# Patient Record
Sex: Male | Born: 1965 | Race: White | Hispanic: No | Marital: Single | Smoking: Never smoker
Health system: Southern US, Community
[De-identification: ages and names within clinical notes are randomized; demographics above are authoritative.]

## PROBLEM LIST (undated history)

## (undated) DIAGNOSIS — J45909 Unspecified asthma, uncomplicated: Secondary | ICD-10-CM

## (undated) HISTORY — PX: SALIVARY GLAND SURGERY: SHX768

## (undated) HISTORY — PX: CHOLECYSTECTOMY: SHX55

---

## 1999-04-13 ENCOUNTER — Encounter: Payer: Self-pay | Admitting: *Deleted

## 1999-04-13 ENCOUNTER — Encounter: Admission: RE | Admit: 1999-04-13 | Discharge: 1999-04-13 | Payer: Self-pay | Admitting: *Deleted

## 1999-07-23 ENCOUNTER — Encounter: Admission: RE | Admit: 1999-07-23 | Discharge: 1999-07-23 | Payer: Self-pay | Admitting: *Deleted

## 1999-07-23 ENCOUNTER — Encounter: Payer: Self-pay | Admitting: *Deleted

## 2003-01-26 ENCOUNTER — Encounter: Payer: Self-pay | Admitting: Emergency Medicine

## 2003-01-27 ENCOUNTER — Inpatient Hospital Stay (HOSPITAL_COMMUNITY): Admission: EM | Admit: 2003-01-27 | Discharge: 2003-01-31 | Payer: Self-pay | Admitting: Emergency Medicine

## 2003-01-30 ENCOUNTER — Encounter (INDEPENDENT_AMBULATORY_CARE_PROVIDER_SITE_OTHER): Payer: Self-pay | Admitting: *Deleted

## 2006-10-31 ENCOUNTER — Ambulatory Visit: Payer: Self-pay | Admitting: Internal Medicine

## 2010-05-28 ENCOUNTER — Emergency Department (HOSPITAL_COMMUNITY)
Admission: EM | Admit: 2010-05-28 | Discharge: 2010-05-29 | Payer: Self-pay | Source: Home / Self Care | Admitting: Emergency Medicine

## 2010-05-29 ENCOUNTER — Encounter (INDEPENDENT_AMBULATORY_CARE_PROVIDER_SITE_OTHER): Payer: Self-pay | Admitting: *Deleted

## 2010-06-01 LAB — URINALYSIS, ROUTINE W REFLEX MICROSCOPIC
Bilirubin Urine: NEGATIVE
Hgb urine dipstick: NEGATIVE
Nitrite: NEGATIVE
Protein, ur: NEGATIVE mg/dL
Specific Gravity, Urine: 1.034 — ABNORMAL HIGH (ref 1.005–1.030)
Urine Glucose, Fasting: NEGATIVE mg/dL
Urobilinogen, UA: 0.2 mg/dL (ref 0.0–1.0)
pH: 7.5 (ref 5.0–8.0)

## 2010-06-01 LAB — DIFFERENTIAL
Basophils Absolute: 0 10*3/uL (ref 0.0–0.1)
Basophils Relative: 0 % (ref 0–1)
Eosinophils Absolute: 0.1 10*3/uL (ref 0.0–0.7)
Eosinophils Relative: 1 % (ref 0–5)
Lymphocytes Relative: 15 % (ref 12–46)
Lymphs Abs: 1.5 10*3/uL (ref 0.7–4.0)
Monocytes Absolute: 0.8 10*3/uL (ref 0.1–1.0)
Monocytes Relative: 7 % (ref 3–12)
Neutro Abs: 7.9 10*3/uL — ABNORMAL HIGH (ref 1.7–7.7)
Neutrophils Relative %: 76 % (ref 43–77)

## 2010-06-01 LAB — CBC
HCT: 47.2 % (ref 39.0–52.0)
Hemoglobin: 16.3 g/dL (ref 13.0–17.0)
MCH: 31.1 pg (ref 26.0–34.0)
MCHC: 34.5 g/dL (ref 30.0–36.0)
MCV: 90.1 fL (ref 78.0–100.0)
Platelets: 214 10*3/uL (ref 150–400)
RBC: 5.24 MIL/uL (ref 4.22–5.81)
RDW: 12.4 % (ref 11.5–15.5)
WBC: 10.3 10*3/uL (ref 4.0–10.5)

## 2010-06-01 LAB — COMPREHENSIVE METABOLIC PANEL
ALT: 44 U/L (ref 0–53)
AST: 28 U/L (ref 0–37)
Albumin: 4.1 g/dL (ref 3.5–5.2)
Alkaline Phosphatase: 63 U/L (ref 39–117)
BUN: 11 mg/dL (ref 6–23)
CO2: 27 mEq/L (ref 19–32)
Calcium: 9.5 mg/dL (ref 8.4–10.5)
Chloride: 105 mEq/L (ref 96–112)
Creatinine, Ser: 1.24 mg/dL (ref 0.4–1.5)
GFR calc Af Amer: >60 mL/min (ref >60)
GFR calc non Af Amer: >60 mL/min (ref >60)
Glucose, Bld: 117 mg/dL — ABNORMAL HIGH (ref 70–99)
Potassium: 4 mEq/L (ref 3.5–5.1)
Sodium: 141 mEq/L (ref 135–145)
Total Bilirubin: 0.7 mg/dL (ref 0.3–1.2)
Total Protein: 7.3 g/dL (ref 6.0–8.3)

## 2010-06-01 LAB — LIPASE, BLOOD: Lipase: 30 U/L (ref 11–59)

## 2010-07-10 ENCOUNTER — Emergency Department (HOSPITAL_COMMUNITY): Payer: BC Managed Care – PPO

## 2010-07-10 ENCOUNTER — Emergency Department (HOSPITAL_COMMUNITY)
Admission: EM | Admit: 2010-07-10 | Discharge: 2010-07-10 | Disposition: A | Discharge status: Home / Self Care | Payer: BC Managed Care – PPO | Attending: Emergency Medicine | Admitting: Emergency Medicine

## 2010-07-10 ENCOUNTER — Encounter (INDEPENDENT_AMBULATORY_CARE_PROVIDER_SITE_OTHER): Payer: Self-pay | Admitting: *Deleted

## 2010-07-10 DIAGNOSIS — K5730 Diverticulosis of large intestine without perforation or abscess without bleeding: Secondary | ICD-10-CM | POA: Insufficient documentation

## 2010-07-10 DIAGNOSIS — K7689 Other specified diseases of liver: Secondary | ICD-10-CM | POA: Insufficient documentation

## 2010-07-10 DIAGNOSIS — R109 Unspecified abdominal pain: Secondary | ICD-10-CM | POA: Insufficient documentation

## 2010-07-10 LAB — URINALYSIS, ROUTINE W REFLEX MICROSCOPIC
Bilirubin Urine: NEGATIVE
Hgb urine dipstick: NEGATIVE
Ketones, ur: >80 mg/dL — AB
Nitrite: NEGATIVE
Protein, ur: NEGATIVE mg/dL
Specific Gravity, Urine: 1.026 (ref 1.005–1.030)
Urine Glucose, Fasting: NEGATIVE mg/dL
Urobilinogen, UA: 0.2 mg/dL (ref 0.0–1.0)
pH: 5.5 (ref 5.0–8.0)

## 2010-07-10 LAB — DIFFERENTIAL
Basophils Absolute: 0 10*3/uL (ref 0.0–0.1)
Basophils Relative: 0 % (ref 0–1)
Eosinophils Absolute: 0 10*3/uL (ref 0.0–0.7)
Eosinophils Relative: 0 % (ref 0–5)
Lymphocytes Relative: 9 % — ABNORMAL LOW (ref 12–46)
Lymphs Abs: 1.1 10*3/uL (ref 0.7–4.0)
Monocytes Absolute: 0.4 10*3/uL (ref 0.1–1.0)
Monocytes Relative: 3 % (ref 3–12)
Neutro Abs: 11.2 10*3/uL — ABNORMAL HIGH (ref 1.7–7.7)
Neutrophils Relative %: 88 % — ABNORMAL HIGH (ref 43–77)

## 2010-07-10 LAB — LIPASE, BLOOD: Lipase: 25 U/L (ref 11–59)

## 2010-07-10 LAB — COMPREHENSIVE METABOLIC PANEL
ALT: 66 U/L — ABNORMAL HIGH (ref 0–53)
AST: 40 U/L — ABNORMAL HIGH (ref 0–37)
Albumin: 4.5 g/dL (ref 3.5–5.2)
Alkaline Phosphatase: 56 U/L (ref 39–117)
BUN: 14 mg/dL (ref 6–23)
CO2: 24 mEq/L (ref 19–32)
Calcium: 9.9 mg/dL (ref 8.4–10.5)
Chloride: 104 mEq/L (ref 96–112)
Creatinine, Ser: 1.13 mg/dL (ref 0.4–1.5)
GFR calc Af Amer: >60 mL/min (ref >60)
GFR calc non Af Amer: >60 mL/min (ref >60)
Glucose, Bld: 152 mg/dL — ABNORMAL HIGH (ref 70–99)
Potassium: 4 mEq/L (ref 3.5–5.1)
Sodium: 139 mEq/L (ref 135–145)
Total Bilirubin: 1.3 mg/dL — ABNORMAL HIGH (ref 0.3–1.2)
Total Protein: 7.6 g/dL (ref 6.0–8.3)

## 2010-07-10 LAB — CBC
HCT: 50.4 % (ref 39.0–52.0)
Hemoglobin: 17.3 g/dL — ABNORMAL HIGH (ref 13.0–17.0)
MCH: 30.5 pg (ref 26.0–34.0)
MCHC: 34.3 g/dL (ref 30.0–36.0)
MCV: 88.9 fL (ref 78.0–100.0)
Platelets: 228 10*3/uL (ref 150–400)
RBC: 5.67 MIL/uL (ref 4.22–5.81)
RDW: 12.2 % (ref 11.5–15.5)
WBC: 12.7 10*3/uL — ABNORMAL HIGH (ref 4.0–10.5)

## 2010-07-10 MED ORDER — IOHEXOL 300 MG/ML  SOLN
100.0000 mL | Freq: Once | INTRAMUSCULAR | Status: AC | PRN
  Administered 2010-07-10: 100 mL via INTRAVENOUS

## 2010-07-14 ENCOUNTER — Ambulatory Visit: Payer: BC Managed Care – PPO | Admitting: Gastroenterology

## 2010-07-14 DIAGNOSIS — R69 Illness, unspecified: Secondary | ICD-10-CM

## 2010-07-24 ENCOUNTER — Other Ambulatory Visit (HOSPITAL_COMMUNITY): Payer: Self-pay | Admitting: Gastroenterology

## 2010-07-24 DIAGNOSIS — R1011 Right upper quadrant pain: Secondary | ICD-10-CM

## 2010-08-06 ENCOUNTER — Other Ambulatory Visit (HOSPITAL_COMMUNITY): Payer: BC Managed Care – PPO

## 2010-08-10 ENCOUNTER — Other Ambulatory Visit (HOSPITAL_COMMUNITY): Payer: Self-pay | Admitting: Gastroenterology

## 2010-08-10 ENCOUNTER — Ambulatory Visit (HOSPITAL_COMMUNITY)
Admission: RE | Admit: 2010-08-10 | Discharge: 2010-08-10 | Disposition: A | Discharge status: Home / Self Care | Payer: BC Managed Care – PPO | Source: Ambulatory Visit | Attending: Gastroenterology | Admitting: Gastroenterology

## 2010-08-10 DIAGNOSIS — R1011 Right upper quadrant pain: Secondary | ICD-10-CM | POA: Insufficient documentation

## 2010-08-10 MED ORDER — TECHNETIUM TC 99M MEBROFENIN IV KIT
5.0000 | PACK | Freq: Once | INTRAVENOUS | Status: AC | PRN
  Administered 2010-08-10: 5 via INTRAVENOUS

## 2010-09-29 NOTE — Assessment & Plan Note (Signed)
[facility]                             PULMONARY OFFICE NOTE   BLISS, TSANG                        MRN:          [id number]  DATE:10/31/2006                            DOB:          02-Apr-1966    This is a pulmonary consultation requested by Clifton Custard   REASON FOR CONSULTATION:  Cough.   HISTORY:  This is a 45 year old white male last seen here in 2004 with a  cough that had developed after exposure to pertussis.  Since that time,  3-4 times a year he gets a bad cough that lasts for several weeks.  His present exacerbation started after an apparent URI with flu-like  symptoms, about a month ago and has persisted with a hacking cough that  is more daytime than nighttime, more dry than wet in nature with a  sensation that he has got something stuck in his throat.  He has  already been started on Symbicort with no benefit.  He also has received  prednisone with no benefit.  Although he states he takes Prevacid, it is  not consistent.   It is not consistent because he doesn't think he has heartburn.   He denies any weather, environmental change, fevers, chills, sweats,  orthopnea, PND, or leg swelling.  No sinus or reflux symptoms.   PAST MEDICAL HISTORY:  Significant on for a diagnosis of asthma.  It is  interesting that he reports this as a diagnosis, as my feeling before he  did not have asthma.   ALLERGIES:  PENICILLIN, TETRACYCLINE CAUSE A RASH.   MEDICATIONS:  1. Prevacid 30 mg once or twice weekly.  2. Symbicort 160/4.5, 2 puffs b.i.d.   SOCIAL HISTORY:  He has never smoked.  He works as a Emergency planning/management officer.   FAMILY HISTORY:  Positive for allergies, the whole family, and  asthma in his mother.   REVIEW OF SYSTEMS:  Taken and reviewed in the work sheet.  Negative  except as outlined above.   PHYSICAL EXAMINATION:  GENERAL:  This is an ambulatory, obese white male  in no acute distress but who clears his throat frequently  during the  interview and exam.  HEENT:  Oropharynx is clear.  However, with no evidence of excessive  postnasal drip.  NECK:  Supple without cervical adenopathy or tenderness.  Trachea is  midline.  No lymphadenopathy.  LUNGS:  Lung fields are perfectly clear bilaterally to auscultation and  percussion with no cough elicited on inspiratory or expiratory  maneuvers.  HEART:  Regular rate and rhythm without murmur, gallop, or rub present.  ABDOMEN:  Soft, benign without palpable organomegaly, mass, or  tenderness.  EXTREMITIES:  Warm without calf tenderness, cyanosis, clubbing, or  edema.   CT scan of the chest done on October 21, 2006, is normal.   IMPRESSION:  Classic upper airway cough syndrome suggests either a  reflux mechanism or cough-inducing reflex inducing more cough via a  cyclical mechanism.   RECOMMENDATIONS:  Take Prevacid perfectly regularly before the first  meal every day.  In fact, he can add a second  one until he is better  from his present exacerbation and use Delsym 2 teaspoons q.12h. plus  Tramadol or hydrocodone q.4h. to suppress excessive coughing.   I spent extra time with this patient today going over the mechanism for  cyclical coughing and the role that reflux may be playing here.   The next step in the workup if this is not successful is to obtain a  sinus CT scan and a methacholine challenge test while on high dose  proton pump inhibitor therapy.     Charlaine Dalton. Sherene Sires, MD, [facility]  Electronically Signed    MBW/MedQ  DD: 10/31/2006  DT: 11/01/2006  Job #: [id number]

## 2010-10-02 NOTE — Discharge Summary (Signed)
Peter Pena, Peter Pena                         ACCOUNT NO.:  [id number]   MEDICAL RECORD NO.:  [id number]                   PATIENT TYPE:  INP   LOCATION:  5702                                 FACILITY:  [facility]   PHYSICIAN:  Lonia Blood, M.D.            DATE OF BIRTH:  1965/10/09   DATE OF ADMISSION:  01/27/2003  DATE OF DISCHARGE:  01/31/2003                                 DISCHARGE SUMMARY   DISCHARGE DIAGNOSES:  1. Reported history of asthma.  2. History of gastroesophageal reflux disease.  3. Allergy to penicillin and Tetracycline.  4. Severe chronic cough of unknown etiology.   DISCHARGE MEDICATIONS:  1. Prednisone 20 mg a day x5 days and then 10 mg a day.  2. Tessalon 100 mg t.i.d.  3. Combivent MDI two puffs q.i.d.  4. Pulmicort MDI two puffs b.i.d.  5. Protonix 40 mg b.i.d.  6. Claritin 10 mg daily.  7. Tussionex 5 mL b.i.d. p.r.n.  8. Tylenol No.3 two every six hours p.r.n.   FOLLOW UP:  The patient will follow up with [name], M.D. at  [facility].  This is the patient's choice as he desires to  change his primary care physician.   CONSULTATIONS:  None.   PROCEDURE:  1. Diagnostic upper GI - sliding hiatal hernia.  Mild reflux.  No other     abnormality.  2. CT scan of the chest.  No evidence of abnormality.   HISTORY OF PRESENT ILLNESS:  The patient is a 45 year old gentleman who was  previously followed at [facility].  He had been followed in  their office for a chronic cough which had developed approximately two weeks  prior to his admission.  On the day of admission, he presented to the office  with marked increase in severity of his cough to the point that the patient  was not able to catch his breath on two different occasions.  The evaluating  physician at that time was concerned that the patient likely had pertussis  and the patient was therefore admitted to the hospital and placed in a  negative pressure  isolation room.   HOSPITAL COURSE:  Chronic severe cough.  The exact etiology of the patient's  cough was not clear. There was concern by the admitting physician that this  could represent pertussis.  The patient was initially kept in respiratory  isolation in a negative pressure room because of concern for pertussis.  Bordetella pertussis panel was obtained and sent to the lab.  Unfortunately,  this is a test that has to be sent out and there was significant delay.  As  a result, I discussed the case with an infectious disease physician who  advised me that the patient could be discontinued from isolation after he  had been treated for approximately five days of therapy.  Considering that  the patient had been initiated on the  Z-Pak prior to his admission, I felt  that he was stable to be discharged from respiratory isolation on January 29, 2003.  In retrospect, it is now clear that the patient's pertussis panel  is consistent with immunization.  The patient was questioned as to  immunization history and did also state that he had all of his immunizations  as a child and growing up and therefore the clinical suspicion for pertussis  was extremely low. There was greater concern that this was reflux disease.  Upper GI series was obtained and revealed a sliding hiatal hernia, but no  other significant findings were appreciated.  There were also multiple  episodes of wheezing and therefore it was felt that this patient's cough  could also be related to significant bronchospasm and asthma.  As a result,  treatment for bronchospasm was maximized during this hospitalization to  include inhaler and prednisone therapy.  The patient was also placed on  Claritin.  Treatment of gastroesophageal reflux disease was maximized as  well with proton pump inhibitor being dosed twice a day.  The patient was  also given multiple antitussives.  The patient does not have a history of  tobacco abuse, but the  decision was made to pursue CT scan of the chest  because there was fear that we could have missed a lesion on plain film.  CT  scan of the chest was obtained and was in fact unremarkable.  At this time,  the patient's symptoms had improved, though, not resolved.  He was felt to  be stable and not contagious.  The patient was cleared for discharge from  acute unit and was advised to follow up with his primary care physician in  short course.  The next step would likely be bronchoscopy by pulmonologist  should the patient's symptoms not resolved.                                                Lonia Blood, M.D.    JTM/MEDQ  D:  03/20/2003  T:  03/21/2003  Job:  [id number]

## 2010-10-02 NOTE — H&P (Signed)
NAME:  Peter Pena, Peter Pena                         ACCOUNT NO.:  [id number]   MEDICAL RECORD NO.:  [id number]                   PATIENT TYPE:  EMS   LOCATION:  MAJO                                 FACILITY:  [facility]   PHYSICIAN:  Gloriajean Dell. Andrey Campanile, M.D.                DATE OF BIRTH:  Oct 15, 1965   DATE OF ADMISSION:  01/26/2003  DATE OF DISCHARGE:                                HISTORY & PHYSICAL   IDENTIFICATION:  A 45 year old married white male from [place].   CHIEF COMPLAINT:  Severe cough until he cannot breathe times two weeks.   HISTORY OF PRESENT ILLNESS:  For two weeks, the patient has had episodic  severe nonproductive cough which comes in spells without fever or other  symptoms.  He was treated in the office this week without apparent benefit  with Tussionex, Z-Pak on his fourth day, Tylenol No. 3 (he only took one at  a time).  Today he was seen in the office.  A chest x-ray was negative.  He  started on prednisone, but only took one dose of 20 mg, and it has not  helped.  He came to the emergency room.  Nebulizer treatment did not help.  He was admitted for cough control and further treatment and consultation.   PAST MEDICAL HISTORY:   ALLERGIES:  1. PENICILLIN.  2. TETRACYCLINE.   MEDICATIONS:  As above.   PAST HISTORY:  1. History of asthma; none recently.  He gets about one episode a year,     which usually responds very well to prednisone and inhalers.  2. History of GERD about three years ago, treated with Prilosec, but none     recently.   FAMILY AND SOCIAL HISTORY:  The patient does not smoke any cigarettes or  excessive alcohol.  His work is as a Nurse, [place].  He has had  no travel.  No one in his family or at work has had a cough.  He is married  and has three children; all are well.  His mother is living and well.  Father died of cardiomyopathy.   REVIEW OF SYSTEMS:  Otherwise unremarkable.   PHYSICAL EXAMINATION:  GENERAL:  A  well-developed, non-obese white male in  no acute distress, except when he is coughing.  He is alert, oriented, and  appropriate.  Speech is clear.  VITAL SIGNS:  Temperature 98.8, pulse 111, respirations 20, blood pressure  123/74.  O2 saturation 99% on room air.  SKIN:  Without rash or lesions.  HEENT:  TM's and ear canals are clear.  Nose and throat clear.  NECK:  Supple, no adenopathy.  Sinuses nontender.  LUNGS:  Clear to auscultation and percussion.  While examining the patient,  he is having repetitive dry cough until he gets out of breath, turns  slightly cyanotic, then takes a deep breath, and returns to normal.  ABDOMEN:  Benign.  CHEST:  Chest wall nontender.  EXTREMITIES:  Normal.  There is no edema, cyanosis, or clubbing.   Chest x-ray reveals no disease.   LABORATORY DATA:  CBC with white count 11,100.  Differential with 72%  neutrophils, 22% lymphocytes, 5% monocytes, 1% basophils.  Hemoglobin 16.1,  platelets 274,000.  MCV 87.  Sodium 139, potassium 3.7, BUN 17, creatinine  1.0, calcium 9.4, glucose 123.  Arterial blood gas on room air revealed pH  of 7.407, PCO2 38.2, PO2 75.  O2 saturation 95.   ASSESSMENT:  1. Severe paroxysmal cough for two weeks, consider pertussis.  2. History of asthma, which has not been active recently.  3. History of gastroesophageal reflux disease, which has not been active     recently.   PLAN:  1. Anti-tussive treatment, codeine, Tessalon Perles, and prednisone.     Continue Zithromax, increasing dose to 500 mg a day.  2. Consultation with I. D. or pulmonary.  3. Oxygen as needed.  4. Respiratory isolation.                                                Gloriajean Dell. Andrey Campanile, M.D.    FHW/MEDQ  D:  01/27/2003  T:  01/27/2003  Job:  [id number]

## 2013-04-16 ENCOUNTER — Ambulatory Visit
Admission: RE | Admit: 2013-04-16 | Discharge: 2013-04-16 | Disposition: A | Discharge status: Home / Self Care | Payer: BC Managed Care – PPO | Source: Ambulatory Visit | Attending: Family Medicine | Admitting: Family Medicine

## 2013-04-16 ENCOUNTER — Other Ambulatory Visit: Payer: Self-pay | Admitting: Family Medicine

## 2013-04-16 DIAGNOSIS — R079 Chest pain, unspecified: Secondary | ICD-10-CM

## 2015-04-26 ENCOUNTER — Encounter (HOSPITAL_COMMUNITY): Payer: Self-pay | Admitting: Nurse Practitioner

## 2015-04-26 ENCOUNTER — Emergency Department (HOSPITAL_COMMUNITY)
Admission: EM | Admit: 2015-04-26 | Discharge: 2015-04-27 | Disposition: A | Discharge status: Home / Self Care | Payer: BLUE CROSS/BLUE SHIELD | Attending: Emergency Medicine | Admitting: Emergency Medicine

## 2015-04-26 ENCOUNTER — Emergency Department (HOSPITAL_COMMUNITY): Payer: BLUE CROSS/BLUE SHIELD

## 2015-04-26 DIAGNOSIS — J40 Bronchitis, not specified as acute or chronic: Secondary | ICD-10-CM | POA: Diagnosis not present

## 2015-04-26 DIAGNOSIS — Z792 Long term (current) use of antibiotics: Secondary | ICD-10-CM | POA: Diagnosis not present

## 2015-04-26 DIAGNOSIS — Z880 Allergy status to penicillin: Secondary | ICD-10-CM | POA: Insufficient documentation

## 2015-04-26 DIAGNOSIS — R197 Diarrhea, unspecified: Secondary | ICD-10-CM | POA: Insufficient documentation

## 2015-04-26 DIAGNOSIS — R109 Unspecified abdominal pain: Secondary | ICD-10-CM | POA: Diagnosis not present

## 2015-04-26 DIAGNOSIS — Z79899 Other long term (current) drug therapy: Secondary | ICD-10-CM | POA: Diagnosis not present

## 2015-04-26 DIAGNOSIS — R509 Fever, unspecified: Secondary | ICD-10-CM | POA: Diagnosis not present

## 2015-04-26 LAB — COMPREHENSIVE METABOLIC PANEL
ALBUMIN: 3.8 g/dL (ref 3.5–5.0)
ALK PHOS: 44 U/L (ref 38–126)
ALT: 42 U/L (ref 17–63)
ANION GAP: 11 (ref 5–15)
AST: 29 U/L (ref 15–41)
BILIRUBIN TOTAL: 1.6 mg/dL — AB (ref 0.3–1.2)
BUN: 17 mg/dL (ref 6–20)
CO2: 23 mmol/L (ref 22–32)
Calcium: 9.1 mg/dL (ref 8.9–10.3)
Chloride: 104 mmol/L (ref 101–111)
Creatinine, Ser: 1.43 mg/dL — ABNORMAL HIGH (ref 0.61–1.24)
GFR calc Af Amer: >60 mL/min (ref >60)
GFR calc non Af Amer: 55 mL/min — ABNORMAL LOW (ref >60)
GLUCOSE: 108 mg/dL — AB (ref 65–99)
POTASSIUM: 3.3 mmol/L — AB (ref 3.5–5.1)
SODIUM: 138 mmol/L (ref 135–145)
TOTAL PROTEIN: 6.4 g/dL — AB (ref 6.5–8.1)

## 2015-04-26 LAB — CBC
HEMATOCRIT: 47.4 % (ref 39.0–52.0)
HEMOGLOBIN: 16.1 g/dL (ref 13.0–17.0)
MCH: 30.3 pg (ref 26.0–34.0)
MCHC: 34 g/dL (ref 30.0–36.0)
MCV: 89.3 fL (ref 78.0–100.0)
Platelets: 224 10*3/uL (ref 150–400)
RBC: 5.31 MIL/uL (ref 4.22–5.81)
RDW: 12.7 % (ref 11.5–15.5)
WBC: 11.3 10*3/uL — ABNORMAL HIGH (ref 4.0–10.5)

## 2015-04-26 LAB — I-STAT CG4 LACTIC ACID, ED: Lactic Acid, Venous: 1.2 mmol/L (ref 0.5–2.0)

## 2015-04-26 MED ORDER — SODIUM CHLORIDE 0.9 % IV BOLUS (SEPSIS)
1000.0000 mL | Freq: Once | INTRAVENOUS | Status: AC
  Administered 2015-04-26: 1000 mL via INTRAVENOUS

## 2015-04-26 MED ORDER — ONDANSETRON HCL 4 MG PO TABS: 4.0000 mg | ORAL_TABLET | Freq: Four times a day (QID) | ORAL | Status: DC

## 2015-04-26 MED ORDER — BARIUM SULFATE 2.1 % PO SUSP
900.0000 mL | Freq: Once | ORAL | Status: AC
  Administered 2015-04-26: 900 mL via ORAL

## 2015-04-26 MED ORDER — ONDANSETRON HCL 4 MG/2ML IJ SOLN
4.0000 mg | Freq: Once | INTRAMUSCULAR | Status: AC
  Administered 2015-04-26: 4 mg via INTRAVENOUS
  Filled 2015-04-26: qty 2

## 2015-04-26 MED ORDER — BARIUM SULFATE 2.1 % PO SUSP
ORAL | Status: AC
  Filled 2015-04-26: qty 2

## 2015-04-26 MED ORDER — METRONIDAZOLE 500 MG PO TABS
500.0000 mg | ORAL_TABLET | Freq: Once | ORAL | Status: AC
  Administered 2015-04-27: 500 mg via ORAL
  Filled 2015-04-26: qty 1

## 2015-04-26 MED ORDER — HYDROCODONE-ACETAMINOPHEN 5-325 MG PO TABS: 1.0000 | ORAL_TABLET | Freq: Four times a day (QID) | ORAL | Status: DC | PRN

## 2015-04-26 MED ORDER — MORPHINE SULFATE (PF) 4 MG/ML IV SOLN
4.0000 mg | Freq: Once | INTRAVENOUS | Status: AC
  Administered 2015-04-26: 4 mg via INTRAVENOUS
  Filled 2015-04-26: qty 1

## 2015-04-26 MED ORDER — METRONIDAZOLE 500 MG PO TABS: 500.0000 mg | ORAL_TABLET | Freq: Three times a day (TID) | ORAL | Status: DC

## 2015-04-26 NOTE — Discharge Instructions (Signed)
Diarrhea Diarrhea is frequent loose and watery bowel movements. It can cause you to feel weak and dehydrated. Dehydration can cause you to become tired and thirsty, have a dry mouth, and have decreased urination that often is dark yellow. Diarrhea is a sign of another problem, most often an infection that will not last long. In most cases, diarrhea typically lasts 2-3 days. However, it can last longer if it is a sign of something more serious. It is important to treat your diarrhea as directed by your caregiver to lessen or prevent future episodes of diarrhea. CAUSES  Some common causes include:  Gastrointestinal infections caused by viruses, bacteria, or parasites.  Food poisoning or food allergies.  Certain medicines, such as antibiotics, chemotherapy, and laxatives.  Artificial sweeteners and fructose.  Digestive disorders. HOME CARE INSTRUCTIONS  Ensure adequate fluid intake (hydration): Have 1 cup (8 oz) of fluid for each diarrhea episode. Avoid fluids that contain simple sugars or sports drinks, fruit juices, whole milk products, and sodas. Your urine should be clear or pale yellow if you are drinking enough fluids. Hydrate with an oral rehydration solution that you can purchase at pharmacies, retail stores, and online. You can prepare an oral rehydration solution at home by mixing the following ingredients together:   - tsp table salt.   tsp baking soda.   tsp salt substitute containing potassium chloride.  1  tablespoons sugar.  1 L (34 oz) of water.  Certain foods and beverages may increase the speed at which food moves through the gastrointestinal (GI) tract. These foods and beverages should be avoided and include:  Caffeinated and alcoholic beverages.  High-fiber foods, such as raw fruits and vegetables, nuts, seeds, and whole grain breads and cereals.  Foods and beverages sweetened with sugar alcohols, such as xylitol, sorbitol, and mannitol.  Some foods may be well  tolerated and may help thicken stool including:  Starchy foods, such as rice, toast, pasta, low-sugar cereal, oatmeal, grits, baked potatoes, crackers, and bagels.  Bananas.  Applesauce.  Add probiotic-rich foods to help increase healthy bacteria in the GI tract, such as yogurt and fermented milk products.  Wash your hands well after each diarrhea episode.  Only take over-the-counter or prescription medicines as directed by your caregiver.  Take a warm bath to relieve any burning or pain from frequent diarrhea episodes. SEEK IMMEDIATE MEDICAL CARE IF:   You are unable to keep fluids down.  You have persistent vomiting.  You have blood in your stool, or your stools are black and tarry.  You do not urinate in 6-8 hours, or there is only a small amount of very dark urine.  You have abdominal pain that increases or localizes.  You have weakness, dizziness, confusion, or light-headedness.  You have a severe headache.  Your diarrhea gets worse or does not get better.  You have a fever or persistent symptoms for more than 2-3 days.  You have a fever and your symptoms suddenly get worse. MAKE SURE YOU:   Understand these instructions.  Will watch your condition.  Will get help right away if you are not doing well or get worse.   This information is not intended to replace advice given to you by your health care provider. Make sure you discuss any questions you have with your health care provider.   Document Released: 04/23/2002 Document Revised: 05/24/2014 Document Reviewed: 01/09/2012 Elsevier Interactive Patient Education 2016 [employer].   FAQs What is Clostridium difficile infection?  Clostridium difficile [pronounced  Klo-STRID-ee-um dif-uh-SEEL], also known as "C. diff" [See-dif], is a germ that can cause diarrhea. Most cases of C. diff infection occur in patients taking antibiotics. The most common symptoms of a C. diff infection include:  Watery  diarrhea  Fever  Loss of appetite  Nausea  Belly pain and tenderness Who is most likely to get C. diff infection? The elderly and people with certain medical problems have the greatest chance of getting C. diff. C. diff spores can live outside the human body for a very long time and may be found on things in the environment such as bed linens, bed rails, bathroom fixtures, and medical equipment. C. diff infection can spread from person-to-person on contaminated equipment and on the hands of doctors, nurses, other healthcare providers and visitors. Can C. diff infection be treated? Yes, there are antibiotics that can be used to treat C. diff. In some severe cases, a person might have to have surgery to remove the infected part of the intestines. This surgery is needed in only 1 or 2 out of every 100 persons with C. diff. What are some of the things that hospitals are doing to prevent C. diff infections? To prevent C. diff infections, doctors, nurses, and other healthcare providers:  Clean their hands with soap and water or an alcohol-based hand rub before and after caring for every patient. This can prevent C. diff and other germs from being passed from one patient to another on their hands.  Carefully clean hospital rooms and medical equipment that have been used for patients with C. diff.  Use Contact Precautions to prevent C. diff from spreading to other patients. Contact Precautions mean:  Whenever possible, patients with C. diff will have a single room or share a room only with someone else who also has C. diff.  Healthcare providers will put on gloves and wear a gown over their clothing while taking care of patients with C. diff.  Visitors may also be asked to wear a gown and gloves.  When leaving the room, hospital providers and visitors remove their gown and gloves and clean their hands.  Patients on Contact Precautions are asked to stay in their hospital rooms as much as  possible. They should not go to common areas, such as the gift shop or cafeteria. They can go to other areas of the hospital for treatments and tests.  Only give patients antibiotics when it is necessary. What can I do to help prevent C. diff infections?  Make sure that all doctors, nurses, and other healthcare providers clean their hands with soap and water or an alcohol-based hand rub before and after caring for you.  If you do not see your providers clean their hands, please ask them to do so.  Only take antibiotics as prescribed by your doctor.  Be sure to clean your own hands often, especially after using the bathroom and before eating. Can my friends and family get C. diff when they visit me? C. diff infection usually does not occur in persons who are not taking antibiotics. Visitors are not likely to get C. diff. Still, to make it safer for visitors, they should:  Clean their hands before they enter your room and as they leave your room  Ask the nurse if they need to wear protective gowns and gloves when they visit you. What do I need to do when I go home from the hospital? Once you are back at home, you can return to your normal routine.  Often, the diarrhea will be better or completely gone before you go home. This makes giving C. diff to other people much less likely. There are a few things you should do, however, to lower the chances of developing C. diff infection again or of spreading it to others.  If you are given a prescription to treat C. diff, take the medicine exactly as prescribed by your doctor and pharmacist. Do not take half-doses or stop before you run out.  Wash your hands often, especially after going to the bathroom and before preparing food.  People who live with you should wash their hands often as well.  If you develop more diarrhea after you get home, tell your doctor immediately.  Your doctor may give you additional instructions. If you have questions,  please ask your doctor or nurse. Developed and co-sponsored by [employer] Society for [employer] of [place] [phone](SHEA); Infectious Diseases Society of America (IDSA); [facility]; Association for Professionals in Infection Control and Epidemiology (APIC); Centers for Disease Control and Prevention (CDC); and [employer].   This information is not intended to replace advice given to you by your health care provider. Make sure you discuss any questions you have with your health care provider.   Document Released: 05/08/2013 Document Revised: 09/17/2014 Document Reviewed: 07/17/2014 Elsevier Interactive Patient Education [employer] Elsevier Inc.

## 2015-04-26 NOTE — ED Notes (Signed)
He is on last dose of levaquin for bronchitis and last night began to have n/v/d. He notced his vomit looked black. His doctor was concerned for c. Diff because of abx therapy. hes had fevers and abd pain too

## 2015-04-26 NOTE — ED Provider Notes (Signed)
CSN: [id number]     Arrival date & time 04/26/15  [id number] History   First MD Initiated Contact with Patient 04/26/15 1850     Chief Complaint  Patient presents with  . Diarrhea     (Consider location/radiation/quality/duration/timing/severity/associated sxs/prior Treatment) HPI Comments: Patient presents to the emergency department with chief complaint of diarrhea. He was sent to the ED by his doctor over concern for C. difficile infection. Patient has been taking Levaquin for the past 10 days for bronchitis. He states that yesterday and today he has had severe watery diarrhea. He states that anytime he eats or drinks anything he immediately has watery diarrhea. He denies seeing any blood in his stool. He reports that he did have one episode of vomiting today which was dark, but denies seeing any blood in the vomit. Additionally, patient complains of left lower quadrant abdominal pain and fever today. He has not taken anything for his symptoms. He is otherwise healthy, and does not have any other medical problems.  The history is provided by the patient. No language interpreter was used.    History reviewed. No pertinent past medical history. History reviewed. No pertinent past surgical history. History reviewed. No pertinent family history. Social History  Substance Use Topics  . Smoking status: Never Smoker   . Smokeless tobacco: None  . Alcohol Use: Yes    Review of Systems  Constitutional: Positive for fever. Negative for chills.  Respiratory: Negative for shortness of breath.   Cardiovascular: Negative for chest pain.  Gastrointestinal: Positive for abdominal pain and diarrhea. Negative for nausea, vomiting and constipation.  Genitourinary: Negative for dysuria.  All other systems reviewed and are negative.     Allergies  Levaquin; Penicillins; and Tetracyclines & related  Home Medications   Prior to Admission medications   Medication Sig Start Date End Date Taking?  Authorizing Provider  atorvastatin (LIPITOR) 10 MG tablet Take 10 mg by mouth every morning. 04/14/15  Yes Historical Provider, MD  B Complex-Folic Acid (SUPER B COMPLEX MAXI) TABS Take 1 tablet by mouth daily.   Yes Historical Provider, MD  COCONUT OIL PO Take 1 capsule by mouth daily as needed (for inflammation).   Yes Historical Provider, MD  dexlansoprazole (DEXILANT) 60 MG capsule Take 60 mg by mouth daily.   Yes Historical Provider, MD  ipratropium (ATROVENT) 0.06 % nasal spray Place 1 spray into both nostrils daily as needed. For congestion 04/14/15  Yes Historical Provider, MD  Multiple Vitamin (MULTIVITAMIN WITH MINERALS) TABS tablet Take 1 tablet by mouth daily.   Yes Historical Provider, MD  Omega-3 Fatty Acids (FISH OIL PO) Take 1 capsule by mouth daily.   Yes Historical Provider, MD  levofloxacin (LEVAQUIN) 500 MG tablet Take 500 mg by mouth daily.    Historical Provider, MD   BP 103/76 mmHg  Pulse 103  Temp(Src) 98.3 F (36.8 C) (Oral)  Resp 17  SpO2 94% Physical Exam  Constitutional: He is oriented to person, place, and time. He appears well-developed and well-nourished.  HENT:  Head: Normocephalic and atraumatic.  Eyes: Conjunctivae and EOM are normal. Pupils are equal, round, and reactive to light. Right eye exhibits no discharge. Left eye exhibits no discharge. No scleral icterus.  Neck: Normal range of motion. Neck supple. No JVD present.  Cardiovascular: Normal rate, regular rhythm and normal heart sounds.  Exam reveals no gallop and no friction rub.   No murmur heard. Pulmonary/Chest: Effort normal and breath sounds normal. No respiratory distress. He has no  wheezes. He has no rales. He exhibits no tenderness.  Abdominal: Soft. He exhibits no distension and no mass. There is no tenderness. There is no rebound and no guarding.  LLQ ttp, no other focal abdominal tenderness  Musculoskeletal: Normal range of motion. He exhibits no edema or tenderness.  Neurological: He is  alert and oriented to person, place, and time.  Skin: Skin is warm and dry.  Psychiatric: He has a normal mood and affect. His behavior is normal. Judgment and thought content normal.  Nursing note and vitals reviewed.   ED Course  Procedures (including critical care time) Results for orders placed or performed during the hospital encounter of 04/26/15  Comprehensive metabolic panel  Result Value Ref Range   Sodium 138 135 - 145 mmol/L   Potassium 3.3 (L) 3.5 - 5.1 mmol/L   Chloride 104 101 - 111 mmol/L   CO2 23 22 - 32 mmol/L   Glucose, Bld 108 (H) 65 - 99 mg/dL   BUN 17 6 - 20 mg/dL   Creatinine, Ser 7.82 (H) 0.61 - 1.24 mg/dL   Calcium 9.1 8.9 - 95.6 mg/dL   Total Protein 6.4 (L) 6.5 - 8.1 g/dL   Albumin 3.8 3.5 - 5.0 g/dL   AST 29 15 - 41 U/L   ALT 42 17 - 63 U/L   Alkaline Phosphatase 44 38 - 126 U/L   Total Bilirubin 1.6 (H) 0.3 - 1.2 mg/dL   GFR calc non Af Amer 55 (L) >60 mL/min   GFR calc Af Amer >60 >60 mL/min   Anion gap 11 5 - 15  CBC  Result Value Ref Range   WBC 11.3 (H) 4.0 - 10.5 K/uL   RBC 5.31 4.22 - 5.81 MIL/uL   Hemoglobin 16.1 13.0 - 17.0 g/dL   HCT 21.3 08.6 - 57.8 %   MCV 89.3 78.0 - 100.0 fL   MCH 30.3 26.0 - 34.0 pg   MCHC 34.0 30.0 - 36.0 g/dL   RDW 46.9 62.9 - 52.8 %   Platelets 224 150 - 400 K/uL  I-Stat CG4 Lactic Acid, ED  Result Value Ref Range   Lactic Acid, Venous 1.20 0.5 - 2.0 mmol/L   Ct Abdomen Pelvis Wo Contrast  04/26/2015  CLINICAL DATA:  Acute onset of nausea, vomiting and diarrhea. Fever and generalized abdominal pain. Initial encounter. EXAM: CT ABDOMEN AND PELVIS WITHOUT CONTRAST TECHNIQUE: Multidetector CT imaging of the abdomen and pelvis was performed following the standard protocol without IV contrast. COMPARISON:  CT of the abdomen and pelvis performed 07/10/2010 FINDINGS: Minimal bibasilar atelectasis is noted. A small to moderate hiatal hernia is seen. The liver and spleen are unremarkable in appearance. The patient is  status post cholecystectomy. The pancreas and adrenal glands are unremarkable. The kidneys are unremarkable in appearance. There is no evidence of hydronephrosis. No renal or ureteral stones are seen. No perinephric stranding is appreciated. No free fluid is identified. The small bowel is unremarkable in appearance. The stomach is within normal limits. No acute vascular abnormalities are seen. Mild nonspecific mesenteric inflammation is noted, mildly more prominent than in 2012. The appendix is normal in caliber, without evidence of appendicitis. Contrast progresses to the level of the rectum. Mild diverticulosis is noted along the descending colon. There is no evidence of diverticulitis. The bladder is mildly distended and grossly unremarkable. The prostate is mildly enlarged, measuring 5.0 cm in transverse dimension. No inguinal lymphadenopathy is seen. No acute osseous abnormalities are identified. There is mild chronic  anterior wedging at L1. IMPRESSION: 1. No acute abnormality seen to explain the patient's symptoms. 2. Mild nonspecific mesenteric inflammation is mildly more prominent than in 2012. 3. Mildly enlarged prostate. 4. Mild diverticulosis along the descending colon, without evidence of diverticulitis. 5. Small to moderate hiatal hernia noted. Electronically Signed   By: Roanna Raider M.D.   On: 04/26/2015 23:09    I have personally reviewed and evaluated these images and lab results as part of my medical decision-making.    MDM   Final diagnoses:  Diarrhea, unspecified type    Patient with frequent watery stools since yesterday.  Has been taking Levaquin for the past 10 days for bronchitis.  Sent to ED for concern over C. Diff.  Will check labs and reassess.  Patient has some abdominal tenderness in the LLQ.  Could be diverticulitis.    Will check CT.  CT negative.  C. Diff panel will not come back until tomorrow.  Discussed with Dr. Rubin Payor.  Will attempt outpatient trial with  flagyl.  Patient is well/stable appearing.  Has good follow-up.  Will give dose of flagyl here.  Recommend that he follow-up with PCP in 3 days.  Discussed with flow manager who will follow-up on results.  Patient will discontinue meds if C.diff is negative.  Return precautions given.  Patient understands and agrees with the plan.  Also discussed with patient his Cr.  Mildly elevated today, likely secondary to dehydration.   Roxy Horseman, PA-C 04/27/15 0000  Benjiman Core, MD 04/27/15 0000

## 2015-04-27 LAB — URINALYSIS, ROUTINE W REFLEX MICROSCOPIC
BILIRUBIN URINE: NEGATIVE
Glucose, UA: NEGATIVE mg/dL
HGB URINE DIPSTICK: NEGATIVE
Ketones, ur: 15 mg/dL — AB
Leukocytes, UA: NEGATIVE
Nitrite: NEGATIVE
PROTEIN: NEGATIVE mg/dL
Specific Gravity, Urine: 1.03 (ref 1.005–1.030)
pH: 5.5 (ref 5.0–8.0)

## 2015-04-27 LAB — C DIFFICILE QUICK SCREEN W PCR REFLEX
C DIFFICLE (CDIFF) ANTIGEN: NEGATIVE
C Diff interpretation: NEGATIVE
C Diff toxin: NEGATIVE

## 2015-08-11 ENCOUNTER — Other Ambulatory Visit: Payer: Self-pay | Admitting: Family Medicine

## 2015-08-11 DIAGNOSIS — R109 Unspecified abdominal pain: Secondary | ICD-10-CM

## 2015-08-18 ENCOUNTER — Ambulatory Visit
Admission: RE | Admit: 2015-08-18 | Discharge: 2015-08-18 | Disposition: A | Discharge status: Home / Self Care | Payer: BLUE CROSS/BLUE SHIELD | Source: Ambulatory Visit | Attending: Family Medicine | Admitting: Family Medicine

## 2015-08-18 DIAGNOSIS — R109 Unspecified abdominal pain: Secondary | ICD-10-CM

## 2015-08-22 ENCOUNTER — Other Ambulatory Visit: Payer: Self-pay | Admitting: Surgery

## 2015-09-15 ENCOUNTER — Other Ambulatory Visit: Payer: Self-pay | Admitting: Surgery

## 2017-01-31 IMAGING — CT CT ABD-PELV W/O CM
2 of 4 series · 9 of 46 positions shown, 10 images · non-contrast
Comparison: CT of the abdomen and pelvis performed 07/10/2010

CLINICAL DATA: Acute onset of nausea, vomiting and diarrhea. Fever
and generalized abdominal pain. Initial encounter.

EXAM:
CT ABDOMEN AND PELVIS WITHOUT CONTRAST
TECHNIQUE: Multidetector CT imaging of the abdomen and pelvis was performed
following the standard protocol without IV contrast.

[Series 201: routine, idose (2) · axial · 0.90mm/px · z∈[+2,+407]mm · 6 of 99 slices shown, 7 images]
[im 9/99  soft-tissue]
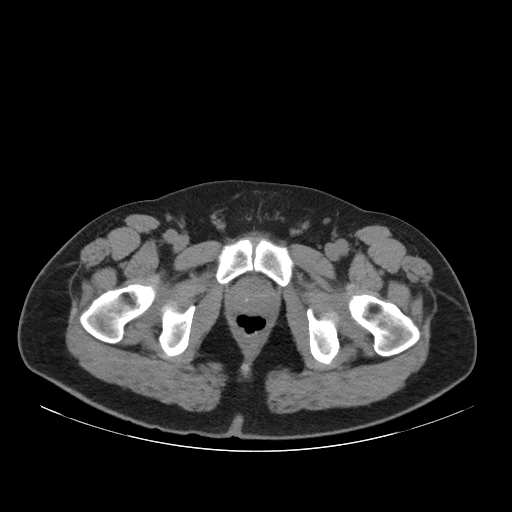
[im 9/99  bone]
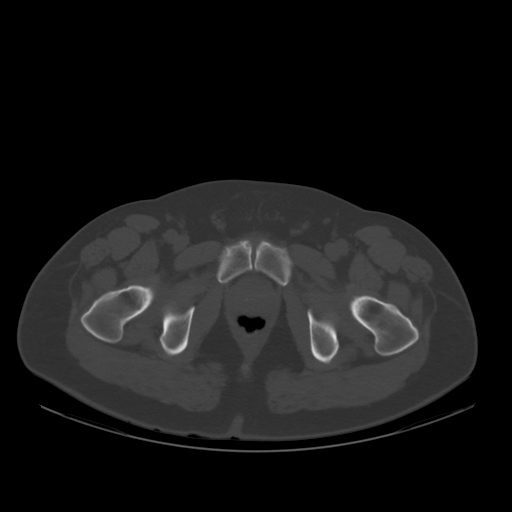
[im 25/99  soft-tissue]
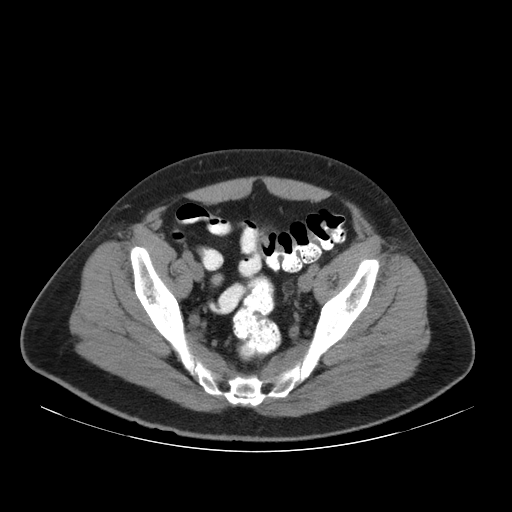
[im 41/99  soft-tissue]
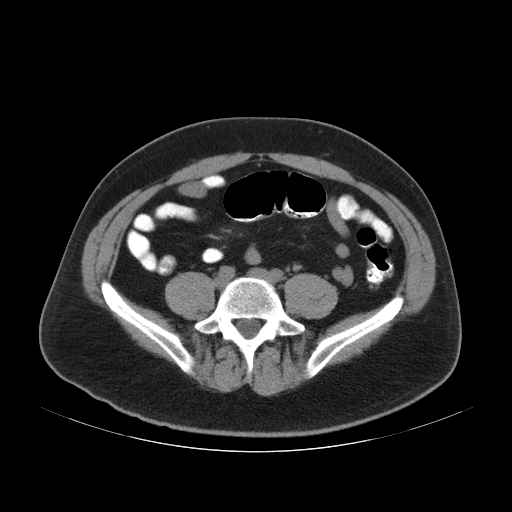
[im 58/99  soft-tissue]
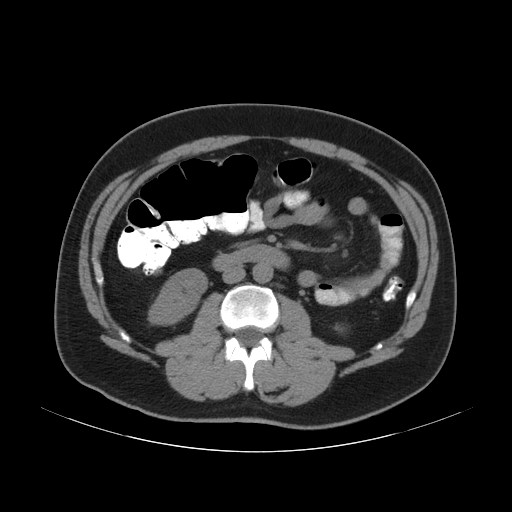
[im 74/99  soft-tissue]
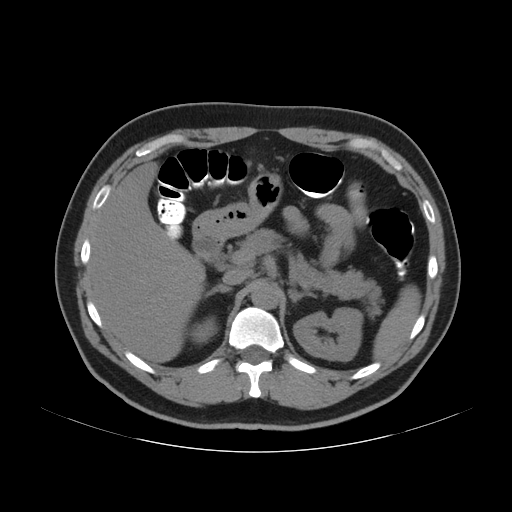
[im 90/99  soft-tissue]
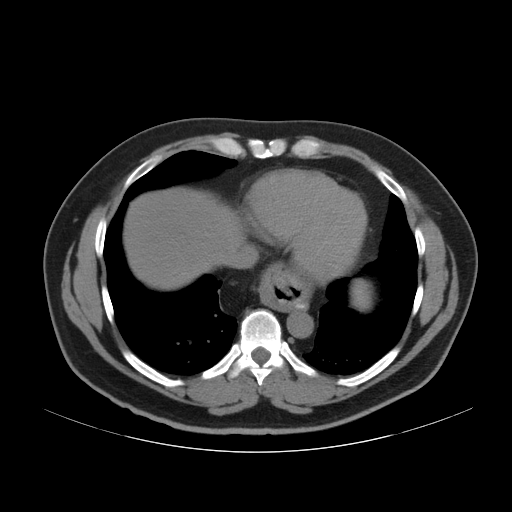

[Series 203: coronals, idose (2) · coronal · 0.45mm/px · 3 of 129 slices shown]
[im 43/129  soft-tissue]
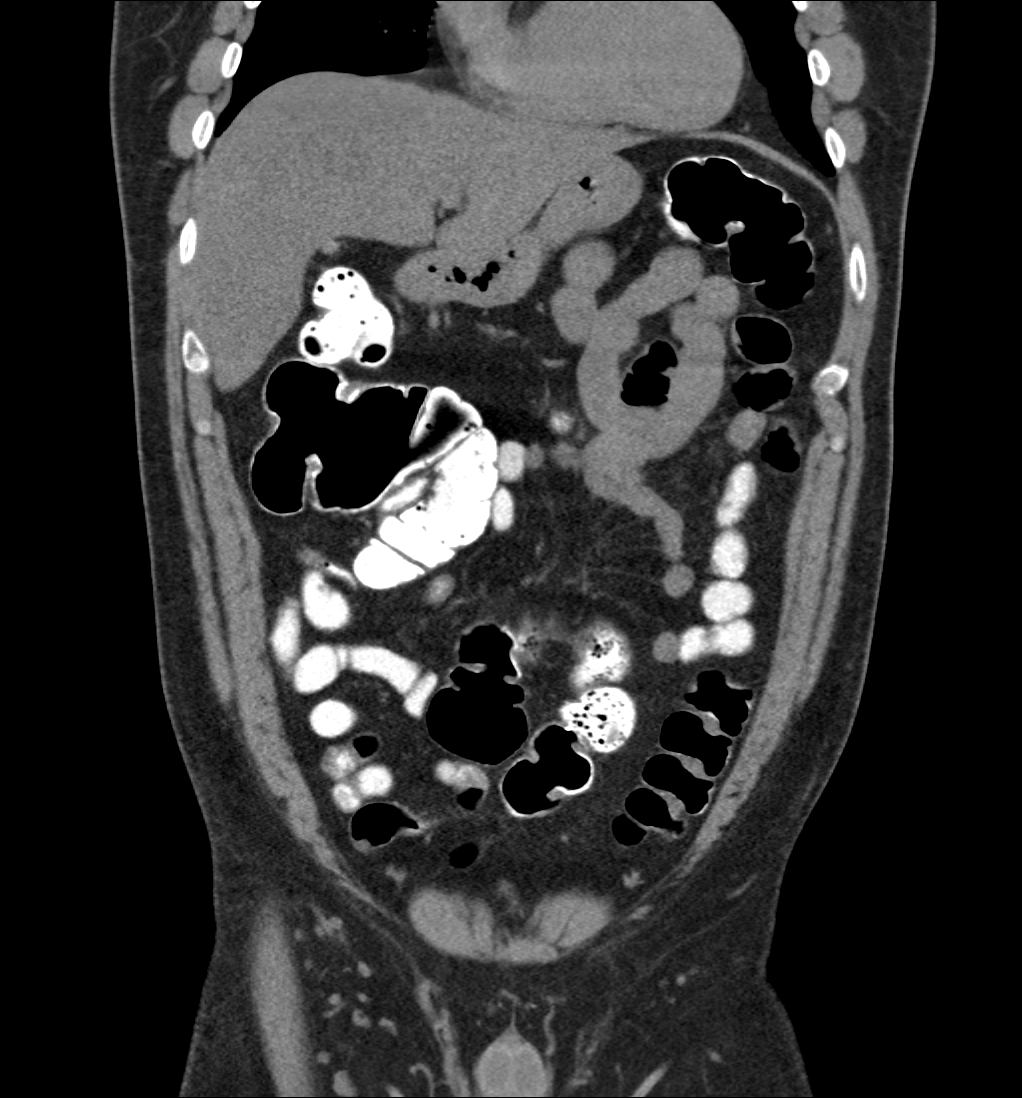
[im 57/129  soft-tissue]
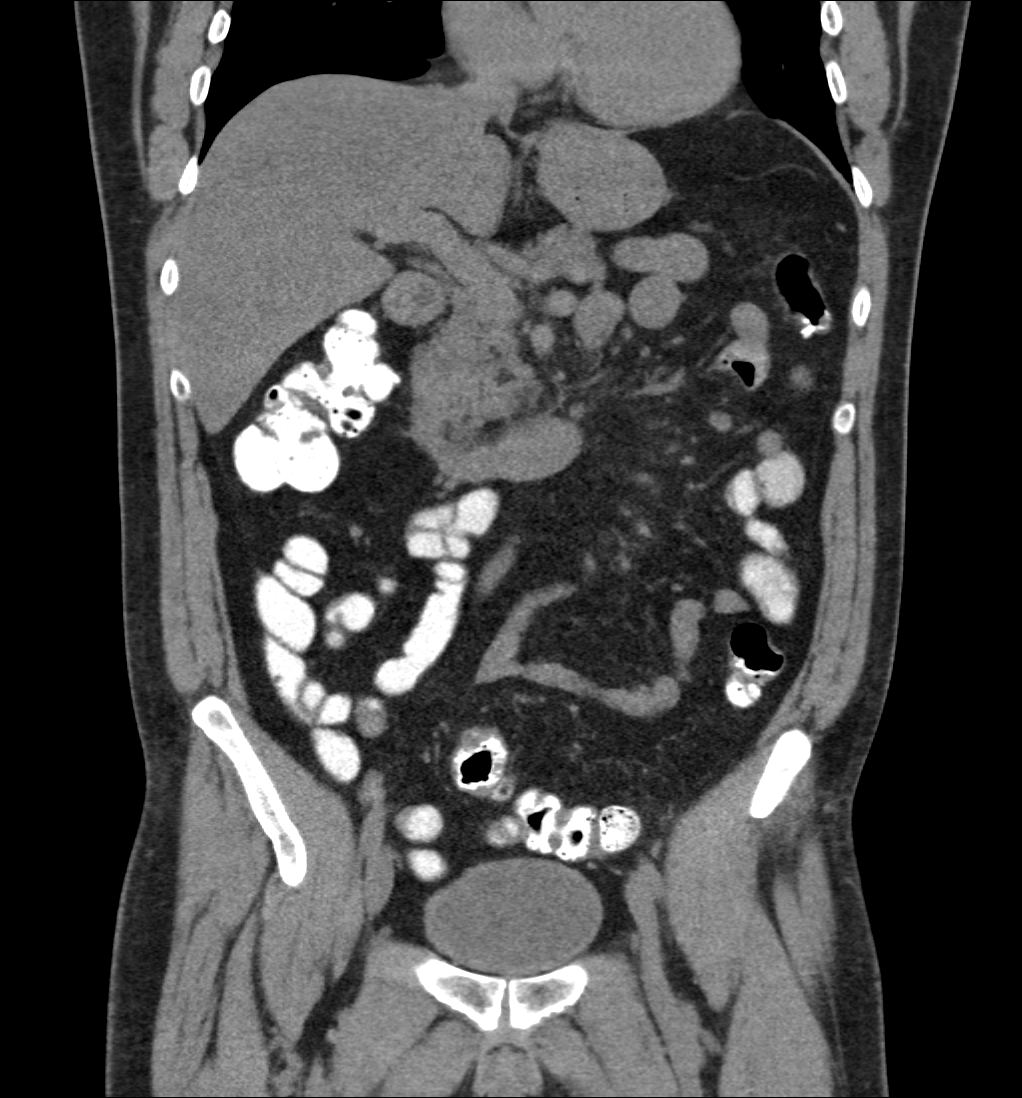
[im 72/129  soft-tissue]
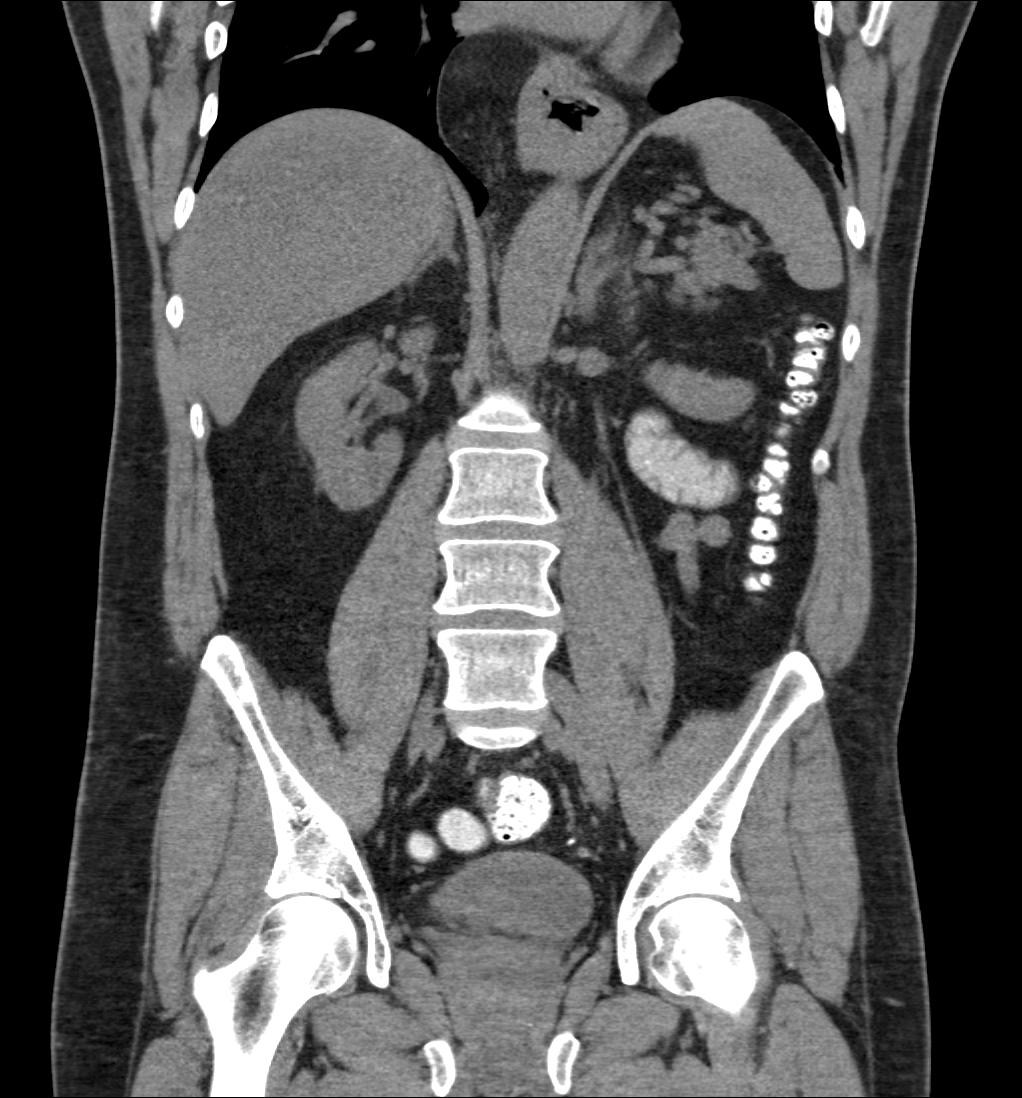

[9 of 46 positions shown; findings below may reference images not displayed]

FINDINGS: Minimal bibasilar atelectasis is noted. A small to moderate hiatal
hernia is seen.

The liver and spleen are unremarkable in appearance. The patient is
status post cholecystectomy. The pancreas and adrenal glands are
unremarkable.

The kidneys are unremarkable in appearance. There is no evidence of
hydronephrosis. No renal or ureteral stones are seen. No perinephric
stranding is appreciated.

No free fluid is identified. The small bowel is unremarkable in
appearance. The stomach is within normal limits. No acute vascular
abnormalities are seen.

Mild nonspecific mesenteric inflammation is noted, mildly more
prominent than in 6336.

The appendix is normal in caliber, without evidence of appendicitis.
Contrast progresses to the level of the rectum. Mild diverticulosis
is noted along the descending colon. There is no evidence of
diverticulitis.

The bladder is mildly distended and grossly unremarkable. The
prostate is mildly enlarged, measuring 5.0 cm in transverse
dimension. No inguinal lymphadenopathy is seen.

No acute osseous abnormalities are identified. There is mild chronic
anterior wedging at L1.
IMPRESSION: 1. No acute abnormality seen to explain the patient's symptoms.
2. Mild nonspecific mesenteric inflammation is mildly more prominent
than in 6336.
3. Mildly enlarged prostate.
4. Mild diverticulosis along the descending colon, without evidence
of diverticulitis.
5. Small to moderate hiatal hernia noted.

## 2017-05-25 IMAGING — US US ABDOMEN COMPLETE
1 series · 14 of 25 positions shown · non-contrast
Comparison: 07/10/2010

CLINICAL DATA: Abdominal pain.  Evaluate for gallbladder disease

EXAM:
ABDOMEN ULTRASOUND COMPLETE

[Series 1: us abdomen complete · 0.32mm/px · 14 of 70 slices shown]
[im 1/70]
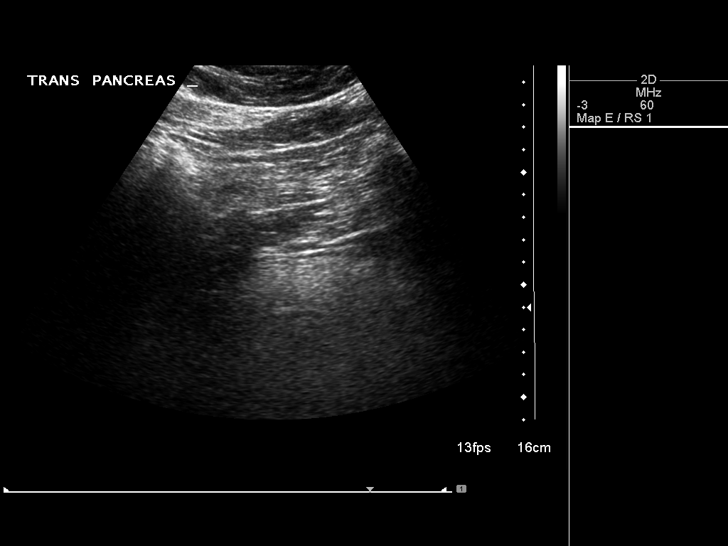
[im 6/70]
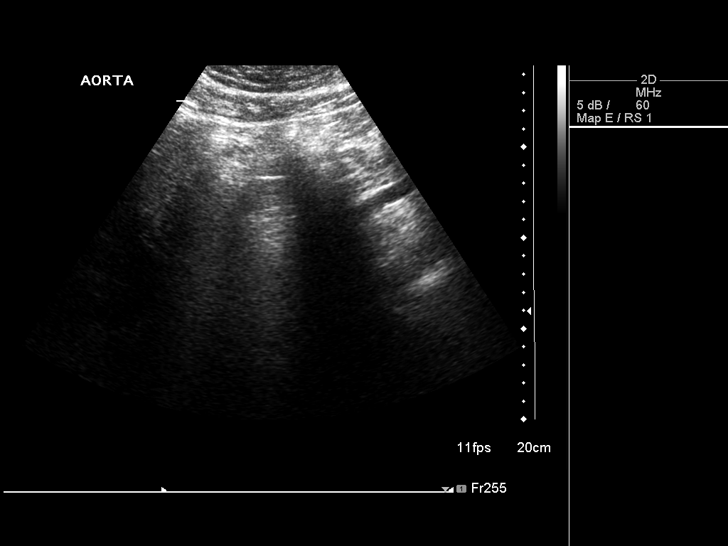
[im 12/70]
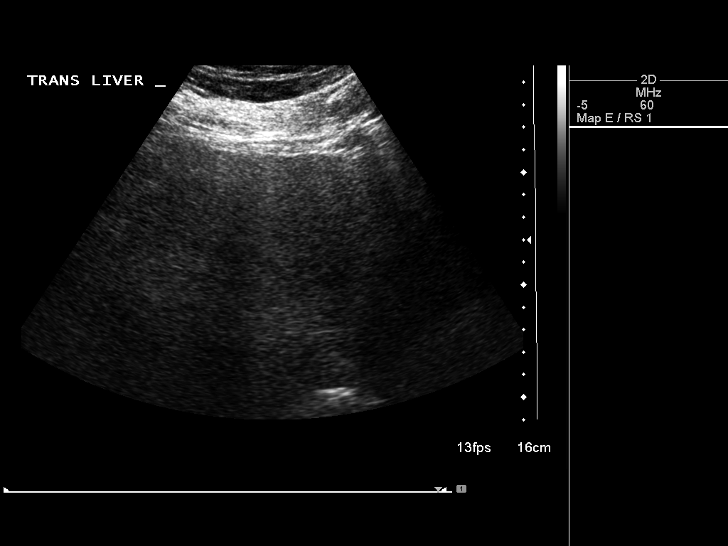
[im 18/70]
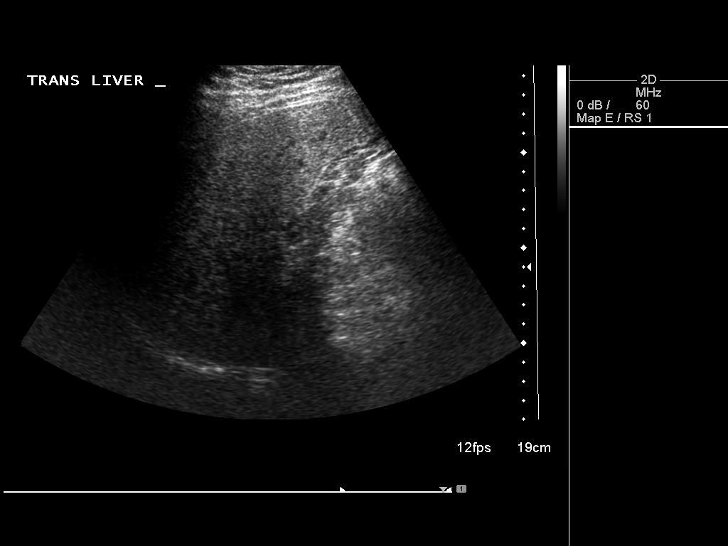
[im 24/70]
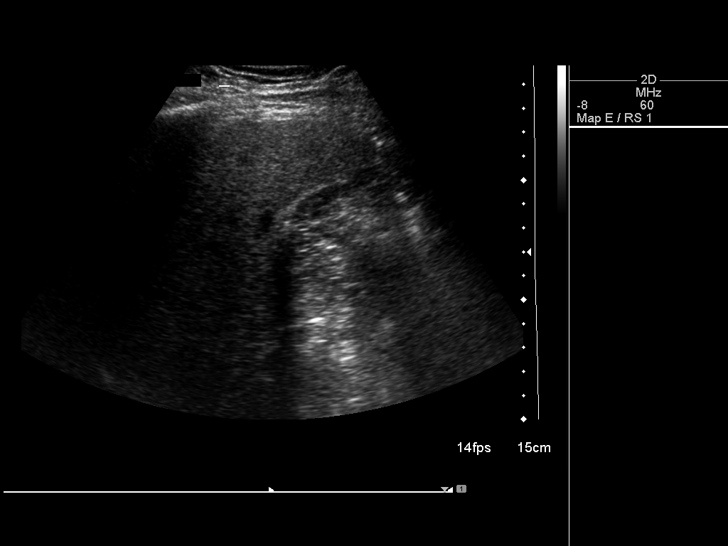
[im 26/70]
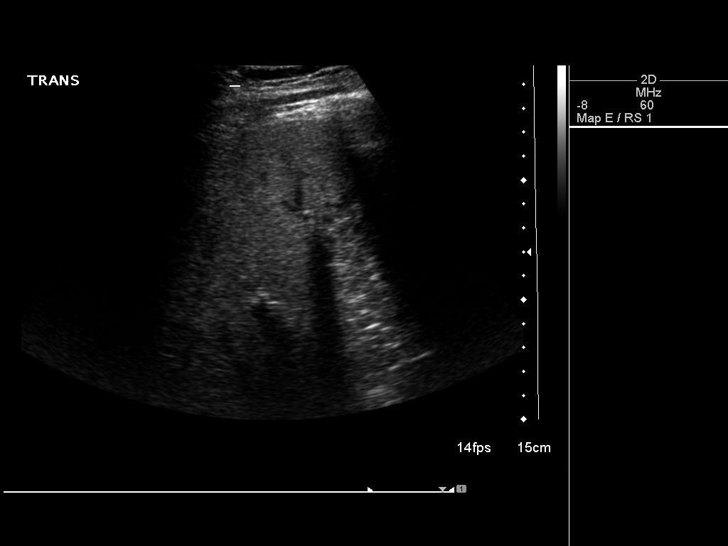
[im 32/70]
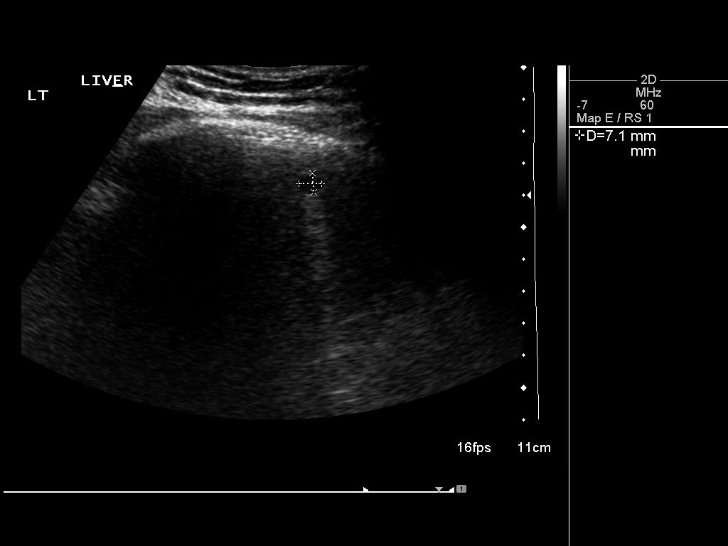
[im 38/70]
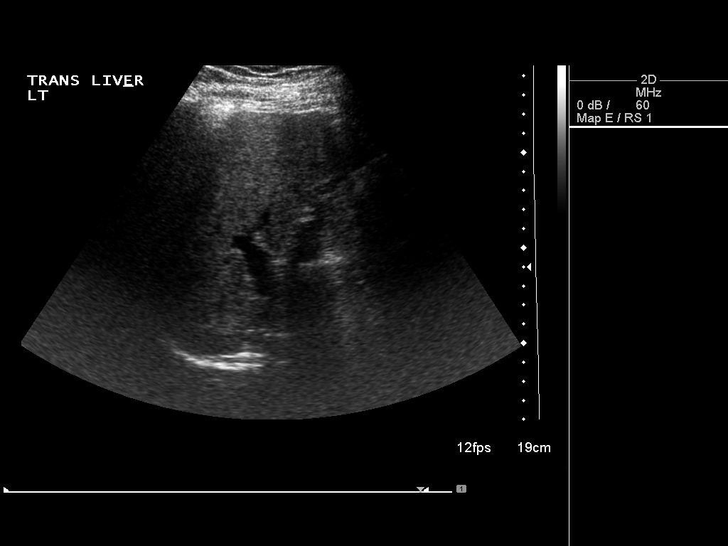
[im 44/70]
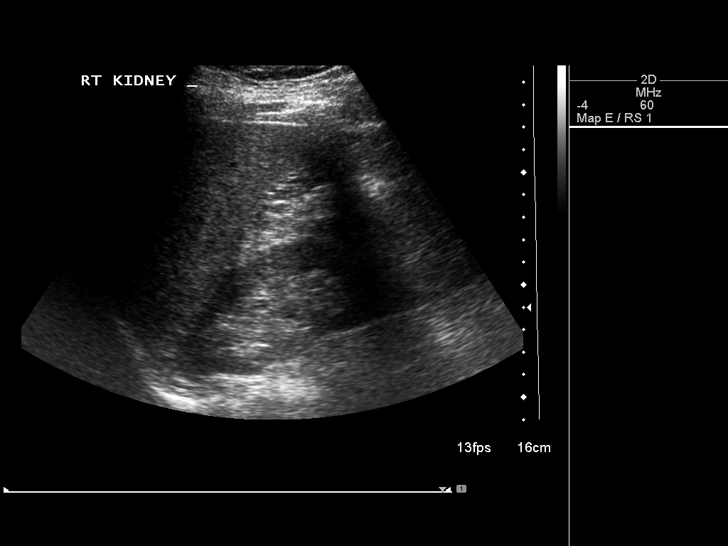
[im 47/70]
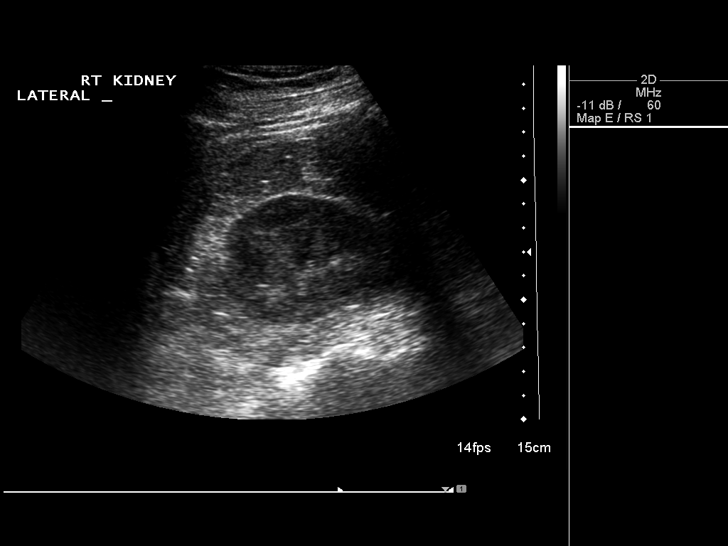
[im 52/70]
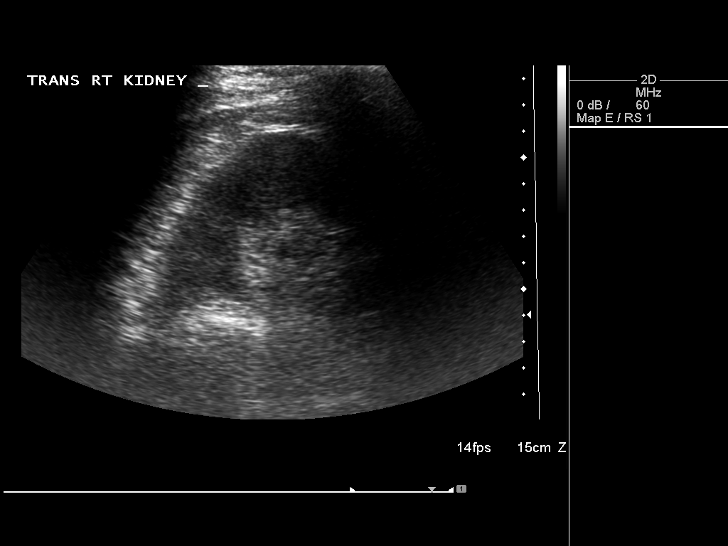
[im 58/70]
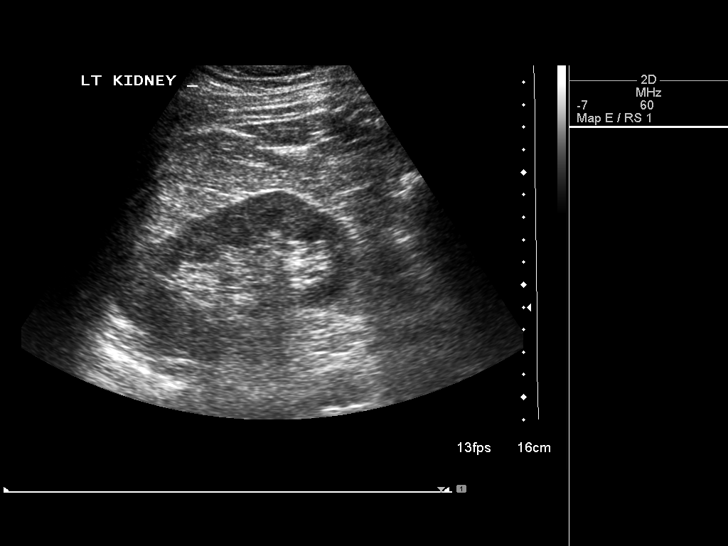
[im 64/70]
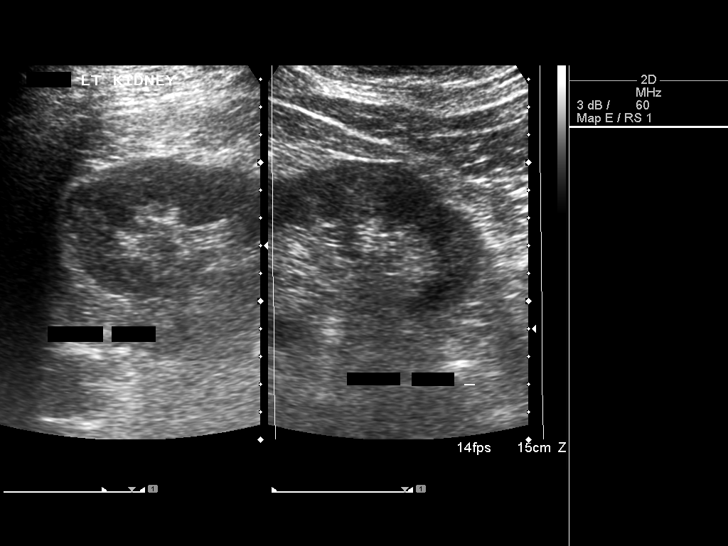
[im 70/70]
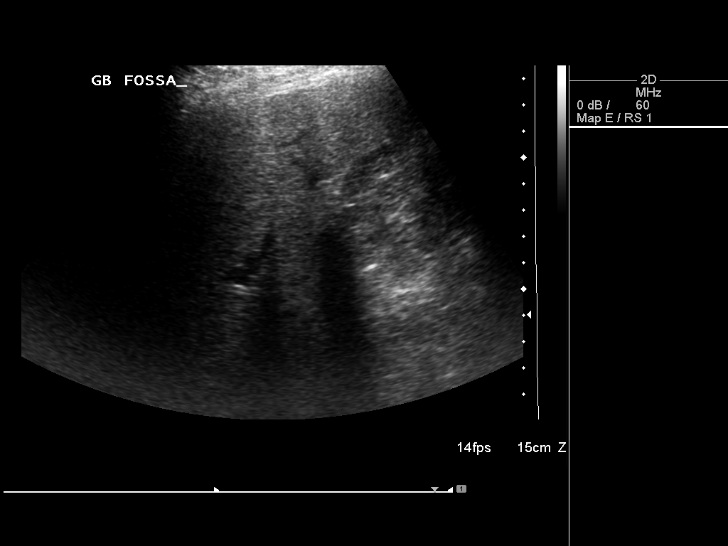

[14 of 25 positions shown; findings below may reference images not displayed]

FINDINGS: Gallbladder: The gallbladder is difficult to visualize, presumably
due to underdistention as patient reports no history of
cholecystectomy. A collapsed gallbladder was likely present on the
previous abdominal CT and noted on 6676 HIDA. There is shadowing in
the gallbladder fossa suggesting calculi. No focal tenderness

Common bile duct:  Not visualized

Liver: Simple 7 mm left hepatic cyst.  No solid masses seen.

IVC: Not visualized due to bowel gas

Pancreas: Not visualized due to bowel gas

Spleen: Size and appearance within normal limits.

Right Kidney: Length: 12 cm. Echogenicity within normal limits. No
mass or hydronephrosis visualized.

Left Kidney: Length: 12 cm. Echogenicity within normal limits. No
mass or hydronephrosis visualized.

Abdominal aorta: No aneurysm visualized.

Other findings: None.
IMPRESSION: 1. Suboptimal gallbladder visualization presumably due to
underdistention. Shadowing in the gallbladder fossa suggest calculi,
which were also seen in 6676.
2. Pancreas obscured by bowel gas.

## 2021-01-29 ENCOUNTER — Emergency Department (HOSPITAL_BASED_OUTPATIENT_CLINIC_OR_DEPARTMENT_OTHER)
Admission: EM | Admit: 2021-01-29 | Discharge: 2021-01-29 | Disposition: A | Discharge status: Home / Self Care | Payer: BLUE CROSS/BLUE SHIELD | Attending: Emergency Medicine | Admitting: Emergency Medicine

## 2021-01-29 ENCOUNTER — Encounter (HOSPITAL_BASED_OUTPATIENT_CLINIC_OR_DEPARTMENT_OTHER): Payer: Self-pay | Admitting: *Deleted

## 2021-01-29 ENCOUNTER — Other Ambulatory Visit: Payer: Self-pay

## 2021-01-29 DIAGNOSIS — T7840XA Allergy, unspecified, initial encounter: Secondary | ICD-10-CM | POA: Insufficient documentation

## 2021-01-29 DIAGNOSIS — L500 Allergic urticaria: Secondary | ICD-10-CM | POA: Diagnosis not present

## 2021-01-29 DIAGNOSIS — J45909 Unspecified asthma, uncomplicated: Secondary | ICD-10-CM | POA: Insufficient documentation

## 2021-01-29 HISTORY — DX: Unspecified asthma, uncomplicated: J45.909

## 2021-01-29 MED ORDER — PREDNISONE 20 MG PO TABS: 40.0000 mg | ORAL_TABLET | Freq: Every day | ORAL | 0 refills | Status: AC

## 2021-01-29 MED ORDER — SODIUM CHLORIDE 0.9 % IV BOLUS
1000.0000 mL | Freq: Once | INTRAVENOUS | Status: AC
Start: 2021-01-29 — End: 2021-01-29
  Administered 2021-01-29: 1000 mL via INTRAVENOUS

## 2021-01-29 MED ORDER — METHYLPREDNISOLONE SODIUM SUCC 125 MG IJ SOLR
125.0000 mg | Freq: Once | INTRAMUSCULAR | Status: AC
Start: 2021-01-29 — End: 2021-01-29
  Administered 2021-01-29: 125 mg via INTRAVENOUS
  Filled 2021-01-29: qty 2

## 2021-01-29 MED ORDER — EPINEPHRINE 0.3 MG/0.3ML IJ SOAJ: 0.3000 mg | INTRAMUSCULAR | 0 refills | Status: AC | PRN

## 2021-01-29 MED ORDER — SODIUM CHLORIDE 0.9 % IV SOLN: INTRAVENOUS | Status: DC

## 2021-01-29 MED ORDER — FAMOTIDINE IN NACL 20-0.9 MG/50ML-% IV SOLN
20.0000 mg | Freq: Once | INTRAVENOUS | Status: AC
Start: 2021-01-29 — End: 2021-01-29
  Administered 2021-01-29: 20 mg via INTRAVENOUS
  Filled 2021-01-29: qty 50

## 2021-01-29 NOTE — ED Triage Notes (Signed)
Bee stung today while working on his yard.  Went to his doctor's office and was given Epipen and steroid was given during his ambulance ride.  Generalized red rashes.

## 2021-01-29 NOTE — ED Provider Notes (Signed)
[facility] EMERGENCY DEPT Provider Note   CSN: [id number] Arrival date & time: 01/29/21  1457     History Chief Complaint  Patient presents with   Allergic Reaction    Peter Pena is a 55 y.o. male.  Patient stung by bee earlier this morning.  Went to primary care doctor's office and given epinephrine after patient had taken Benadryl at home.  He had diffuse rash and having some difficulty breathing and primary care doctor gave him epinephrine.  He was sent here by ambulance.  Symptoms are improving.  Still feels itchy with a red rash throughout but does not have any respiratory symptoms.  No GI symptoms.  The history is provided by the patient.  Allergic Reaction Presenting symptoms: difficulty breathing and rash   Severity:  Moderate Duration:  1 hour Prior allergic episodes:  No prior episodes Context: insect bite/sting   Relieved by:  Epinephrine and antihistamines Worsened by:  Nothing     Past Medical History:  Diagnosis Date   Asthma     There are no problems to display for this patient.   Past Surgical History:  Procedure Laterality Date   CHOLECYSTECTOMY     SALIVARY GLAND SURGERY         History reviewed. No pertinent family history.  Social History   Tobacco Use   Smoking status: Never   Smokeless tobacco: Never  Vaping Use   Vaping Use: Never used  Substance Use Topics   Alcohol use: Yes    Comment: occasionally   Drug use: No    Home Medications Prior to Admission medications   Medication Sig Start Date End Date Taking? Authorizing Provider  atorvastatin (LIPITOR) 10 MG tablet Take 10 mg by mouth every morning. 04/14/15  Yes [provider]  EPINEPHrine 0.3 mg/0.3 mL IJ SOAJ injection Inject 0.3 mg into the muscle as needed for anaphylaxis. 01/29/21  Yes Steele Stracener, DO  predniSONE (DELTASONE) 20 MG tablet Take 2 tablets (40 mg total) by mouth daily for 4 days. 01/29/21 02/02/21 Yes Chandan Fly, DO     Allergies    Hornet venom, Penicillins, and Tetracyclines & related  Review of Systems   Review of Systems  Constitutional:  Negative for chills and fever.  HENT:  Negative for ear pain and sore throat.   Eyes:  Negative for pain and visual disturbance.  Respiratory:  Negative for cough and shortness of breath.   Cardiovascular:  Negative for chest pain and palpitations.  Gastrointestinal:  Negative for abdominal pain and vomiting.  Genitourinary:  Negative for dysuria and hematuria.  Musculoskeletal:  Negative for arthralgias and back pain.  Skin:  Positive for rash. Negative for color change.  Neurological:  Negative for seizures and syncope.  All other systems reviewed and are negative.  Physical Exam Updated Vital Signs BP 122/83   Pulse 92   Temp 98.2 F (36.8 C) (Oral)   Resp 14   Ht 5\' 11"  (1.803 m)   Wt 102.1 kg   SpO2 94%   BMI 31.38 kg/m   Physical Exam Vitals and nursing note reviewed.  Constitutional:      General: He is not in acute distress.    Appearance: He is well-developed. He is not ill-appearing.  HENT:     Head: Normocephalic and atraumatic.     Nose: Nose normal.     Mouth/Throat:     Mouth: Mucous membranes are moist.     Pharynx: No oropharyngeal exudate or posterior oropharyngeal erythema.  Eyes:     Extraocular Movements: Extraocular movements intact.     Conjunctiva/sclera: Conjunctivae normal.     Pupils: Pupils are equal, round, and reactive to light.  Cardiovascular:     Rate and Rhythm: Normal rate and regular rhythm.     Pulses: Normal pulses.     Heart sounds: Normal heart sounds. No murmur heard. Pulmonary:     Effort: Pulmonary effort is normal. No respiratory distress.     Breath sounds: Normal breath sounds.  Abdominal:     Palpations: Abdomen is soft.     Tenderness: There is no abdominal tenderness.  Musculoskeletal:     Cervical back: Neck supple.  Skin:    General: Skin is warm and dry.     Capillary Refill:  Capillary refill takes less than 2 seconds.     Findings: Rash present.     Comments: Diffuse hives throughout  Neurological:     General: No focal deficit present.     Mental Status: He is alert.    ED Results / Procedures / Treatments   Labs (all labs ordered are listed, but only abnormal results are displayed) Labs Reviewed - No data to display  EKG None  Radiology No results found.  Procedures Procedures   Medications Ordered in ED Medications  sodium chloride 0.9 % bolus 1,000 mL (1,000 mLs Intravenous New Bag/Given 01/29/21 1545)    And  0.9 %  sodium chloride infusion (has no administration in time range)  methylPREDNISolone sodium succinate (SOLU-MEDROL) 125 mg/2 mL injection 125 mg (125 mg Intravenous Given 01/29/21 1546)  famotidine (PEPCID) IVPB 20 mg premix (20 mg Intravenous New Bag/Given 01/29/21 1550)    ED Course  I have reviewed the triage vital signs and the nursing notes.  Pertinent labs & imaging results that were available during my care of the patient were reviewed by me and considered in my medical decision making (see chart for details).    MDM Rules/Calculators/A&P                           Peter Pena is here for allergic reaction.  Stung by a hornet several times.  Normal vitals upon arrival.  No fever.  Received epinephrine at primary care doctor's office after taking Benadryl.  He was having some respiratory symptoms and epinephrine was given.  States that he has been stung by bees in the past without any issues.  Suspect that he did have anaphylactic type reaction.  We will give Solu-Medrol, Pepcid and monitor for a few hours.  He appears to be improving.  Still has a rash throughout but does not have any swelling of his tongue or lips.  Patient was observed in the ED for several hours.  He had basically resolution of all of his symptoms.  Rash appears to have completely resolved.  No respiratory issues.  No GI issues.  Given prescriptions for  EpiPen and prednisone.  Educated about anaphylaxis and when to use EpiPen.  Recommend follow-up with primary care doctor and discharged from the ED in good condition.  This chart was dictated using voice recognition software.  Despite best efforts to proofread,  errors can occur which can change the documentation meaning.  Final Clinical Impression(s) / ED Diagnoses Final diagnoses:  Allergic reaction, initial encounter    Rx / DC Orders ED Discharge Orders          Ordered    predniSONE (DELTASONE) 20 MG  tablet  Daily        01/29/21 1824    EPINEPHrine 0.3 mg/0.3 mL IJ SOAJ injection  As needed        01/29/21 Kandis Ban, DO 01/29/21 1825

## 2021-01-29 NOTE — Discharge Instructions (Addendum)
Continue to take Benadryl as needed as well.  Take prednisone as prescribed.  Use EpiPen as discussed.  Follow-up with your primary care doctor.

## 2021-01-29 NOTE — ED Notes (Signed)
Dc home with belongings. REviewed pts dc instructions and prescriptions. Pt voiced understanding.
